# Patient Record
Sex: Male | Born: 1985 | Race: White | Hispanic: No | Marital: Married | Smoking: Never smoker
Health system: Southern US, Community
[De-identification: ages and names within clinical notes are randomized; demographics above are authoritative.]

## PROBLEM LIST (undated history)

## (undated) HISTORY — PX: WISDOM TOOTH EXTRACTION: SHX21

---
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
## 2019-01-18 DIAGNOSIS — H1031 Unspecified acute conjunctivitis, right eye: Secondary | ICD-10-CM | POA: Diagnosis not present

## 2020-08-02 ENCOUNTER — Other Ambulatory Visit: Payer: Self-pay

## 2020-08-02 ENCOUNTER — Ambulatory Visit (INDEPENDENT_AMBULATORY_CARE_PROVIDER_SITE_OTHER): Payer: BC Managed Care – PPO | Admitting: Family Medicine

## 2020-08-02 ENCOUNTER — Encounter: Payer: Self-pay | Admitting: Family Medicine

## 2020-08-02 VITALS — BP 112/75 | HR 86 | Ht 75.0 in | Wt 333.0 lb

## 2020-08-02 DIAGNOSIS — R1033 Periumbilical pain: Secondary | ICD-10-CM | POA: Diagnosis not present

## 2020-08-02 NOTE — Patient Instructions (Addendum)
Great to meet you today!  You can stop downstairs to schedule the Ultrasound.  We'll be in touch with results and next steps.

## 2020-08-02 NOTE — Progress Notes (Signed)
Marco Jones - 34 y.o. male MRN [id number]  Date of birth: [birth year]  Subjective No chief complaint on file.   HPI Marco Jones is a 34 y.o. male here today for initial visit.   He has been in pretty good healthy with out any long term medical conditions.  He has concern about possible hernia around his umbilicus.  He feels like abdomen feels asymmetric and has mild pain at times.  He denies changes to bowels.  He sometimes has some mild bleeding related to hemorrhoids.    ROS:  A comprehensive ROS was completed and negative except as noted per HPI  Allergies  Allergen Reactions  . Imodium A-D [Loperamide Hcl] Hives    No past medical history on file.  Past Surgical History:  Procedure Laterality Date  . WISDOM TOOTH EXTRACTION      Social History   Socioeconomic History  . Marital status: Married    Spouse name: Not on file  . Number of children: Not on file  . Years of education: Not on file  . Highest education level: Not on file  Occupational History  . Not on file  Tobacco Use  . Smoking status: Never Smoker  . Smokeless tobacco: Never Used  Substance and Sexual Activity  . Alcohol use: Not Currently  . Drug use: Never  . Sexual activity: Not on file  Other Topics Concern  . Not on file  Social History Narrative  . Not on file   Social Determinants of Health   Financial Resource Strain:   . Difficulty of Paying Living Expenses: Not on file  Food Insecurity:   . Worried About Programme researcher, broadcasting/film/video in the Last Year: Not on file  . Ran Out of Food in the Last Year: Not on file  Transportation Needs:   . Lack of Transportation (Medical): Not on file  . Lack of Transportation (Non-Medical): Not on file  Physical Activity:   . Days of Exercise per Week: Not on file  . Minutes of Exercise per Session: Not on file  Stress:   . Feeling of Stress : Not on file  Social Connections:   . Frequency of Communication with Friends and Family: Not on file  . Frequency  of Social Gatherings with Friends and Family: Not on file  . Attends Religious Services: Not on file  . Active Member of Clubs or Organizations: Not on file  . Attends Banker Meetings: Not on file  . Marital Status: Not on file    Family History  Problem Relation Age of Onset  . Hypertension Father   . Diabetes Maternal Grandmother   . Cancer Maternal Grandfather        lung & colon  . Stroke Paternal Grandfather     Health Maintenance  Topic Date Due  . Hepatitis C Screening  Never done  . COVID-19 Vaccine (1) Never done  . HIV Screening  Never done  . TETANUS/TDAP  Never done  . INFLUENZA VACCINE  Never done     ----------------------------------------------------------------------------------------------------------------------------------------------------------------------------------------------------------------- Physical Exam BP 112/75   Pulse 86   Ht 6\' 3"  (1.905 m)   Wt (!) 333 lb (151 kg)   BMI 41.62 kg/m   Physical Exam HENT:     Head: Normocephalic and atraumatic.  Eyes:     General: No scleral icterus. Cardiovascular:     Rate and Rhythm: Normal rate and regular rhythm.  Pulmonary:     Effort: Pulmonary effort is normal.  Breath sounds: Normal breath sounds.  Abdominal:     Comments: Mild asymmetry of abdominal wall, more noticeable when standing.  No definite palpable abdominal wall defect or hernia. Mild tenderness adjacent to umbilicus.    Musculoskeletal:     Cervical back: Neck supple.  Neurological:     General: No focal deficit present.     Mental Status: He is alert.  Psychiatric:        Mood and Affect: Mood normal.        Behavior: Behavior normal.     ------------------------------------------------------------------------------------------------------------------------------------------------------------------------------------------------------------------- Assessment and Plan  Umbilical pain No definite hernia  or abdominal wall defect palpated.  Will obtain US abdomen for further evaluation of area.     No orders of the defined types were placed in this encounter.   No follow-ups on file.    This visit occurred during the SARS-CoV-2 public health emergency.  Safety protocols were in place, including screening questions prior to the visit, additional usage of staff PPE, and extensive cleaning of exam room while observing appropriate contact time as indicated for disinfecting solutions.

## 2020-08-02 NOTE — Assessment & Plan Note (Signed)
No definite hernia or abdominal wall defect palpated.  Will obtain US abdomen for further evaluation of area.

## 2020-08-03 ENCOUNTER — Ambulatory Visit (INDEPENDENT_AMBULATORY_CARE_PROVIDER_SITE_OTHER): Payer: BC Managed Care – PPO

## 2020-08-03 DIAGNOSIS — R1033 Periumbilical pain: Secondary | ICD-10-CM

## 2022-04-13 IMAGING — US US ABDOMEN COMPLETE
1 series · 14 of 25 positions shown · non-contrast
Comparison: None.

CLINICAL DATA: Concern for umbilical hernia. Umbilical pain for 2
weeks.

EXAM:
ABDOMEN ULTRASOUND COMPLETE

[Series 1: us abdomen complete · 0.12mm/px · 97 acquisitions, 14 frames shown]
[im 1/97]
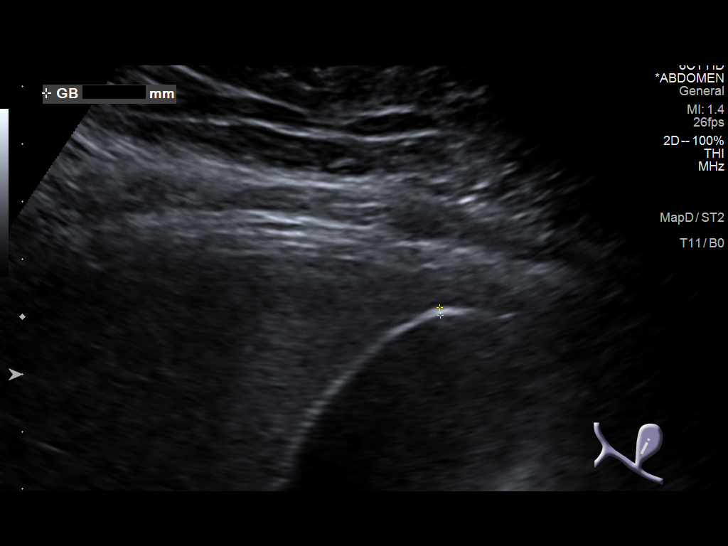
[im 9/97]
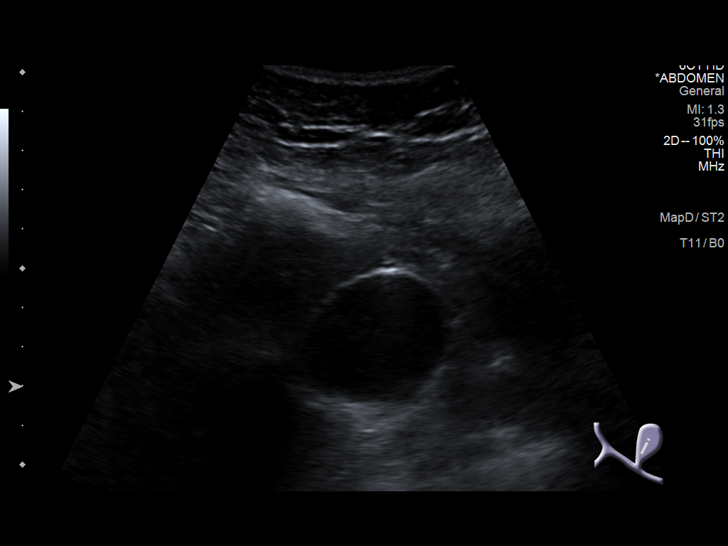
[im 17/97]
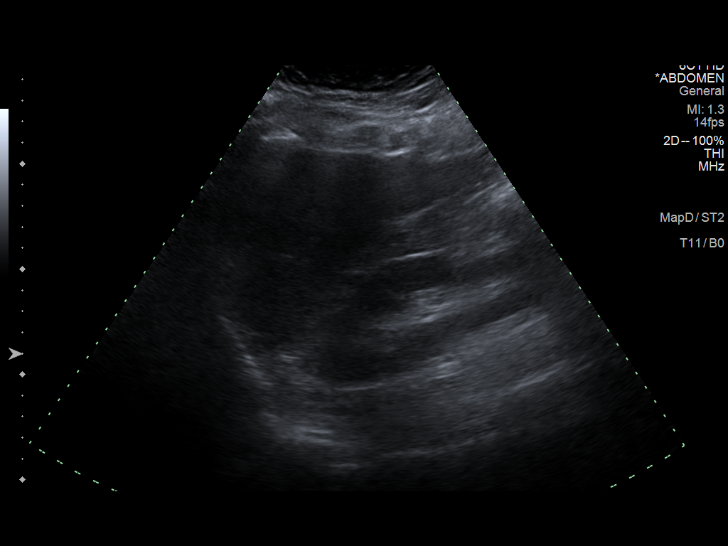
[im 25/97]
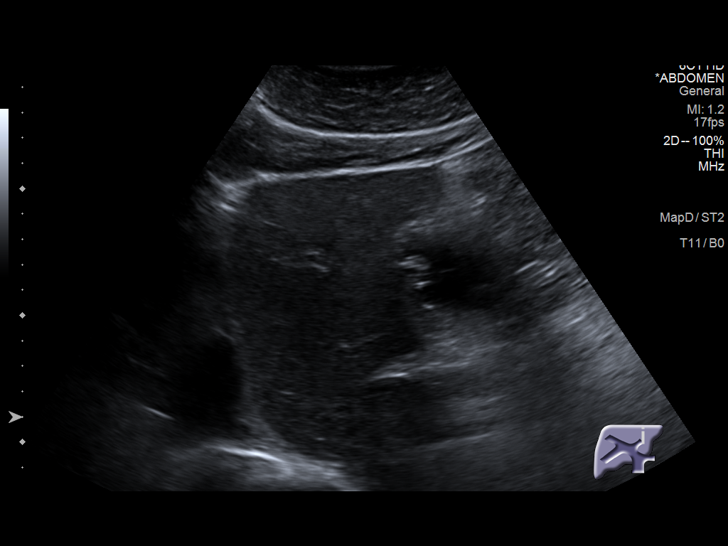
[im 33/97]
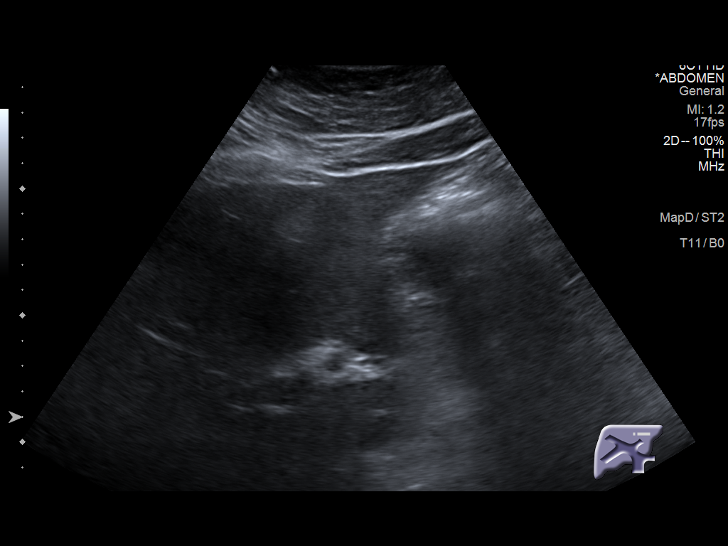
[im 37/97]
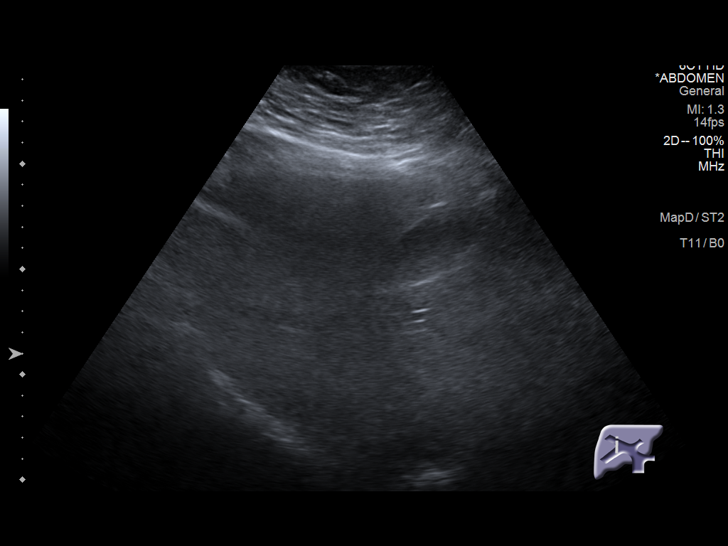
[im 45/97]
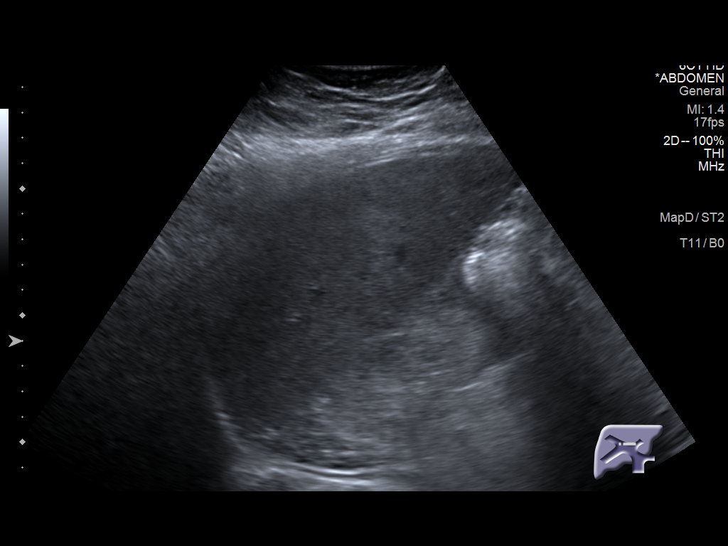
[im 53/97]
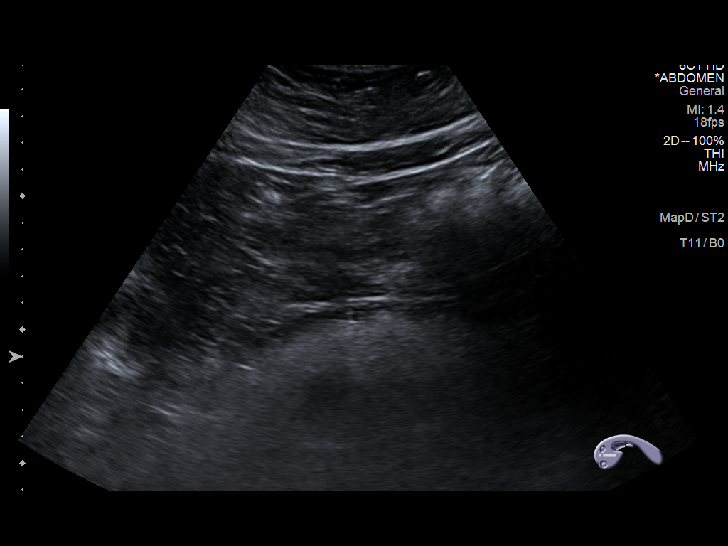
[im 61/97]
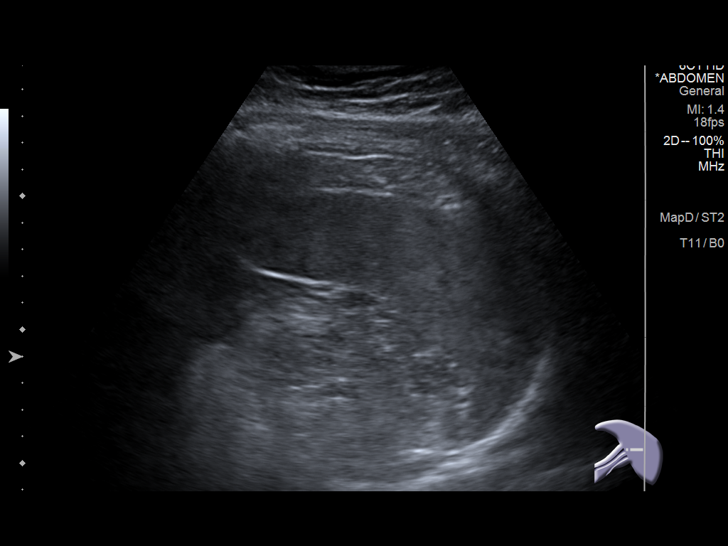
[im 65/97]
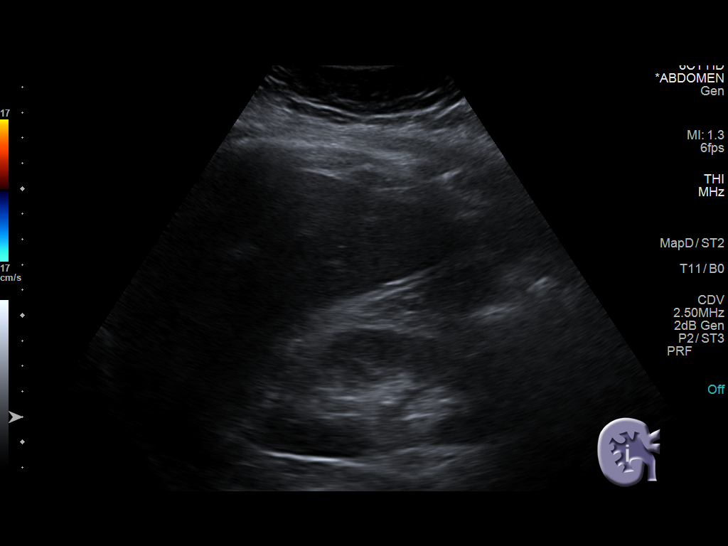
[im 73/97]
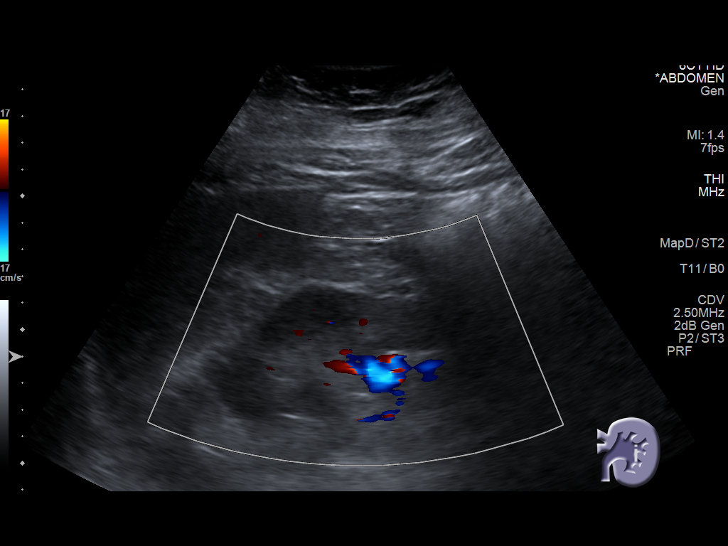
[im 81/97]
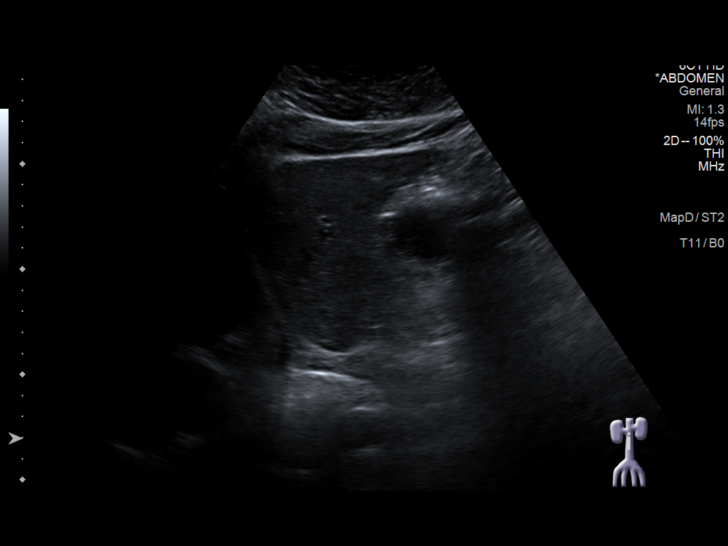
[im 89/97]
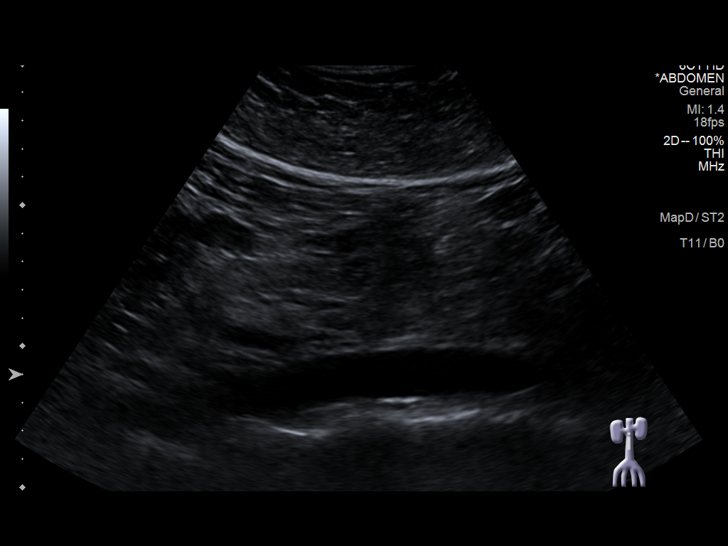
[im 97/97]
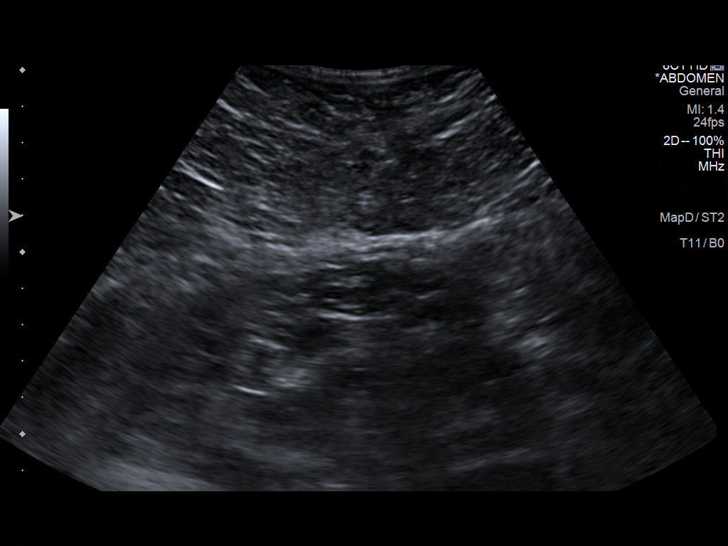

[14 of 25 positions shown; findings below may reference images not displayed]

FINDINGS: Gallbladder: No gallstones or wall thickening visualized. No
sonographic Murphy sign noted by sonographer.

Common bile duct: Diameter: 0.5 cm, within normal limits

Liver: No focal lesion identified. Within normal limits in
parenchymal echogenicity. Portal vein is patent on color Doppler
imaging with normal direction of blood flow towards the liver.

IVC: No abnormality visualized.

Pancreas: Limited visualization.  Visualized portion unremarkable.

Spleen: Size and appearance within normal limits.

Right Kidney: Length: 13.1 cm. Echogenicity within normal limits. No
mass or hydronephrosis visualized.

Left Kidney: Length: 12.4 cm. Echogenicity within normal limits. No
mass or hydronephrosis visualized.

Abdominal aorta: No aneurysm visualized.

Other findings: Targeted ultrasound performed in the periumbilical
region demonstrating no definite hernia.
IMPRESSION: 1.  No sonographic finding to explain the patient's abdominal pain.

2.  No evidence of a periumbilical hernia.
